# Patient Record
Sex: Male | Born: 1960
Health system: Southern US, Community
[De-identification: ages and names within clinical notes are randomized; demographics above are authoritative.]

---

## 2017-01-10 ENCOUNTER — Ambulatory Visit (INDEPENDENT_AMBULATORY_CARE_PROVIDER_SITE_OTHER): Payer: 59 | Admitting: Osteopathic Medicine

## 2017-01-10 ENCOUNTER — Encounter: Payer: Self-pay | Admitting: Osteopathic Medicine

## 2017-01-10 VITALS — BP 118/82 | HR 82 | Ht 71.0 in | Wt 211.0 lb

## 2017-01-10 DIAGNOSIS — M6740 Ganglion, unspecified site: Secondary | ICD-10-CM | POA: Diagnosis not present

## 2017-01-10 DIAGNOSIS — Z720 Tobacco use: Secondary | ICD-10-CM | POA: Diagnosis not present

## 2017-01-10 DIAGNOSIS — Z1211 Encounter for screening for malignant neoplasm of colon: Secondary | ICD-10-CM

## 2017-01-10 DIAGNOSIS — Z13220 Encounter for screening for lipoid disorders: Secondary | ICD-10-CM | POA: Diagnosis not present

## 2017-01-10 DIAGNOSIS — K5792 Diverticulitis of intestine, part unspecified, without perforation or abscess without bleeding: Secondary | ICD-10-CM

## 2017-01-10 DIAGNOSIS — Z23 Encounter for immunization: Secondary | ICD-10-CM | POA: Diagnosis not present

## 2017-01-10 MED ORDER — METRONIDAZOLE 500 MG PO TABS: 500.0000 mg | ORAL_TABLET | Freq: Three times a day (TID) | ORAL | 0 refills | Status: DC

## 2017-01-10 MED ORDER — CIPROFLOXACIN HCL 500 MG PO TABS: 500.0000 mg | ORAL_TABLET | Freq: Two times a day (BID) | ORAL | 0 refills | Status: DC

## 2017-01-10 NOTE — Patient Instructions (Addendum)
Clear Liquid Diet, Adult A clear liquid diet is a diet that includes only liquids that you can see through. You may need to follow a clear liquid diet if:  You develop a medical condition right before or after you have surgery.  You were not able to eat food for a long period of time.  You had a condition that gave you diarrhea.  You are going to have an exam, such as a colonoscopy, in which instruments will be put into your body to look at parts of your digestive system.  You are going to have bowel surgery. The usual goals of this diet are:  To rest the stomach and digestive system as much as possible.  To keep you hydrated.  To make sure you get some calories for energy.  To help you return to normal digestion. Most people need to follow this diet for only a short period of time. What do I need to know about this diet?  A clear liquid is a liquid that you can see through when you hold it up to a light.  A clear liquid diet does not provide all the nutrients that you need. It is important to choose a variety of the liquids that are allowed on this diet. That way, you will get as many nutrients as possible.  If you are not sure whether you can have certain items, ask your health care provider. What can I have?  Water and flavored water.  Fruit juices that do not have pulp, such as cranberry juice and apple juice.  Tea and coffee without milk or cream.  Clear bouillon or broth.  Broth-based soups that have been strained.  Flavored gelatins.  Honey.  Sugar water.  Frozen ice or frozen ice pops that do not contain milk, yogurt, fruit pieces, or fruit pulp.  Clear sodas.  Clear sports drinks. The items listed above may not be a complete list of recommended liquids. Contact your dietitian for more options.  What can I not have?  Juices that have pulp.  Milk.  Cream or cream-based soups.  Yogurt. The items listed above may not be a complete list of liquids to  avoid. Contact your dietitian for more information.  Summary  A clear liquid diet is a diet that includes only liquids that you can see through.  The goal of this diet is to help you recover by resting your digestive system, keeping you hydrated, and providing nutrients.  Make sure to avoid liquids with milk, cream, or pulp while on this diet. This information is not intended to replace advice given to you by your health care provider. Make sure you discuss any questions you have with your health care provider. Document Released: 11/01/2005 Document Revised: 06/15/2016 Document Reviewed: 09/28/2013 Elsevier Interactive Patient Education  2017 [employer]

## 2017-01-10 NOTE — Progress Notes (Signed)
HPI: Austin Reid is a 56 y.o. male  who presents to [facility] today, 01/10/17,  for chief complaint of:  Chief Complaint  Patient presents with  . Establish Care    Abd pain . Context: Has happened maybe 6 times over the past 10 years or so, typically will get left lower quadrant abdominal pain and fever associated with some loose stool, last for couple days and then goes away on its own. . Location: Left lower quadrant . Quality: Tenderness, sharp pain . Duration: Going on for about a day . Assoc signs/symptoms: Fever up to 100.1 last night, no nausea/vomiting, about 5 episodes of loose stool  Smoker: Patient has plans to quit within the next few months. No quit date decided on just yet. Not interested in medications at this time, would like to try cold [place].  Lump on wrist: Has been there maybe a few months, nonpainful, does not interfere with motion of the rest  Past medical, surgical, social and family history reviewed: There are no active problems to display for this patient.  History reviewed. No pertinent surgical history. Social History  Substance Use Topics  . Smoking status: Unknown If Ever Smoked  . Smokeless tobacco: Never Used  . Alcohol use Not on file   History reviewed. No pertinent family history.   Current medication list and allergy/intolerance information reviewed:   No current outpatient prescriptions on file.   No current facility-administered medications for this visit.    No Known Allergies    Review of Systems:  Constitutional:  +fever, no chills, No recent illness, No unintentional weight changes. No significant fatigue.   HEENT: No  headache, no vision change, no hearing change, No sore throat, No  sinus pressure  Cardiac: No  chest pain, No  pressure, No palpitations, No  Orthopnea  Respiratory:  No  shortness of breath. No  Cough  Gastrointestinal: +abdominal pain, No  nausea, No  vomiting,  No   blood in stool, +diarrhea, No  constipation   Musculoskeletal: No new myalgia/arthralgia  Genitourinary: No  incontinence, No  abnormal genital bleeding, No abnormal genital discharge  Skin: No  Rash, No other wounds/concerning lesions  Hem/Onc: No  easy bruising/bleeding, No  abnormal lymph node  Endocrine: No cold intolerance,  No heat intolerance. No polyuria/polydipsia/polyphagia   Neurologic: No  weakness, No  dizziness, No  slurred speech/focal weakness/facial droop  Psychiatric: No  concerns with depression, No  concerns with anxiety, No sleep problems, No mood problems  Exam:  BP 118/82   Pulse 82   Ht 5\' 11"  (1.803 m)   Wt 211 lb (95.7 kg)   BMI 29.43 kg/m   Constitutional: VS see above. General Appearance: alert, well-developed, well-nourished, NAD  Eyes: Normal lids and conjunctive, non-icteric sclera  Ears, Nose, Mouth, Throat: MMM, Normal external inspection ears/nares/mouth/lips/gums. TM normal bilaterally. Pharynx/tonsils no erythema, no exudate. Nasal mucosa normal.   Neck: No masses, trachea midline. No thyroid enlargement. No tenderness/mass appreciated. No lymphadenopathy  Respiratory: Normal respiratory effort. no wheeze, no rhonchi, no rales  Cardiovascular: S1/S2 normal, no murmur, no rub/gallop auscultated. RRR. No lower extremity edema. Pedal pulse II/IV bilaterally DP and PT. No carotid bruit or JVD. No abdominal aortic bruit.  Gastrointestinal: +TTP LLQ, no masses. No hepatomegaly, no splenomegaly. No hernia appreciated. Bowel sounds normal. Rectal exam deferred.   Musculoskeletal: Gait normal. No clubbing/cyanosis of digits.   Neurological: Normal balance/coordination. No tremor.   Skin: warm, dry, intact. No rash/ulcer.  No concerning nevi or subq nodules on limited exam.    Psychiatric: Normal judgment/insight. Normal mood and affect. Oriented x3.     ASSESSMENT/PLAN:   Likely diverticulitis/colitis. We'll defer CT for now, start  antibiotics, ER precautions reviewed in detail with the patient and he verbalizes understanding.  Encouraged smoking cessation, quit date  Opts for cologuard colon cancer screening, patient is aware that may need colonoscopy depending on results  Plan for routine labs  Decline flu vaccine, up-to-date on tetanus  Diverticulitis of intestine without bleeding, unspecified complication status, unspecified part of intestinal tract - Plan: ciprofloxacin (CIPRO) 500 MG tablet, metroNIDAZOLE (FLAGYL) 500 MG tablet, CBC with Differential/Platelet, COMPLETE METABOLIC PANEL WITH GFR  Colon cancer screening - Plan: Cologuard  Need for diphtheria-tetanus-pertussis (Tdap) vaccine, adult/adolescent - Plan: Tdap vaccine greater than or equal to 7yo IM  Tobacco abuse  Lipid screening - Plan: Lipid panel  Ganglion cyst    Visit summary with medication list and pertinent instructions was printed for patient to review. All questions at time of visit were answered - patient instructed to contact office with any additional concerns. ER/RTC precautions were reviewed with the patient. Follow-up plan: Return in about 4 days (around 01/14/2017) for recheck abdominal pain, go over labs.

## 2017-01-11 ENCOUNTER — Telehealth: Payer: Self-pay

## 2017-01-11 LAB — CBC WITH DIFFERENTIAL/PLATELET
Basophils Absolute: 0 cells/uL (ref 0–200)
Basophils Relative: 0 %
EOS ABS: 116 {cells}/uL (ref 15–500)
Eosinophils Relative: 1 %
HEMATOCRIT: 48.6 % (ref 38.5–50.0)
Hemoglobin: 16.8 g/dL (ref 13.2–17.1)
LYMPHS PCT: 19 %
Lymphs Abs: 2204 cells/uL (ref 850–3900)
MCH: 30.7 pg (ref 27.0–33.0)
MCHC: 34.6 g/dL (ref 32.0–36.0)
MCV: 88.7 fL (ref 80.0–100.0)
MONO ABS: 1276 {cells}/uL — AB (ref 200–950)
MONOS PCT: 11 %
MPV: 9.4 fL (ref 7.5–12.5)
NEUTROS PCT: 69 %
Neutro Abs: 8004 cells/uL — ABNORMAL HIGH (ref 1500–7800)
Platelets: 178 10*3/uL (ref 140–400)
RBC: 5.48 MIL/uL (ref 4.20–5.80)
RDW: 13.6 % (ref 11.0–15.0)
WBC: 11.6 10*3/uL — ABNORMAL HIGH (ref 3.8–10.8)

## 2017-01-11 LAB — COMPLETE METABOLIC PANEL WITH GFR
ALT: 20 U/L (ref 9–46)
AST: 16 U/L (ref 10–35)
Albumin: 4.4 g/dL (ref 3.6–5.1)
Alkaline Phosphatase: 55 U/L (ref 40–115)
BUN: 10 mg/dL (ref 7–25)
CALCIUM: 9.7 mg/dL (ref 8.6–10.3)
CHLORIDE: 101 mmol/L (ref 98–110)
CO2: 29 mmol/L (ref 20–31)
CREATININE: 0.99 mg/dL (ref 0.70–1.33)
GFR, EST NON AFRICAN AMERICAN: 84 mL/min (ref >=60)
Glucose, Bld: 97 mg/dL (ref 65–99)
POTASSIUM: 4.1 mmol/L (ref 3.5–5.3)
Sodium: 138 mmol/L (ref 135–146)
Total Bilirubin: 1 mg/dL (ref 0.2–1.2)
Total Protein: 7.2 g/dL (ref 6.1–8.1)

## 2017-01-11 LAB — LIPID PANEL
CHOLESTEROL: 215 mg/dL — AB (ref <200)
HDL: 51 mg/dL (ref >40)
LDL Cholesterol: 129 mg/dL — ABNORMAL HIGH (ref <100)
TRIGLYCERIDES: 176 mg/dL — AB (ref <150)
Total CHOL/HDL Ratio: 4.2 Ratio (ref <5.0)
VLDL: 35 mg/dL — AB (ref <30)

## 2017-01-11 NOTE — Telephone Encounter (Signed)
Spoke to patient gave him results as noted below.Patient stated that he will try a cholesterol medication. Please advise Rx is to go to [facility].  Arionna Hoggard,CMA

## 2017-01-25 DIAGNOSIS — Z1211 Encounter for screening for malignant neoplasm of colon: Secondary | ICD-10-CM | POA: Diagnosis not present

## 2017-01-25 DIAGNOSIS — Z1212 Encounter for screening for malignant neoplasm of rectum: Secondary | ICD-10-CM | POA: Diagnosis not present

## 2017-01-31 LAB — COLOGUARD: Cologuard: NEGATIVE

## 2017-02-01 ENCOUNTER — Telehealth: Payer: Self-pay | Admitting: Osteopathic Medicine

## 2017-02-01 NOTE — Telephone Encounter (Signed)
Please call patient: Cologuard test was negative, plan to repeat in 3 years

## 2017-02-03 ENCOUNTER — Encounter: Payer: Self-pay | Admitting: Osteopathic Medicine

## 2017-02-17 NOTE — Telephone Encounter (Signed)
Spoke to patient gave him results as noted below. Deaja Rizo,CMA  

## 2017-08-15 ENCOUNTER — Telehealth: Payer: Self-pay | Admitting: Family Medicine

## 2017-08-15 NOTE — Telephone Encounter (Signed)
I talked to patient on Saturday September 29. He was complaining of abdominal pain consistent with previous episodes of diverticulitis. He had a history of fever but at the time was feeling a bit better and afebrile after Tylenol. He denies any vomiting or diarrhea. He requested a prescription for antibiotics that worked last time. I agreed to prescribe Cipro and Flagyl and discussed precautions. He should present to the emergency department if not improving or if worsening.

## 2017-12-24 ENCOUNTER — Emergency Department (INDEPENDENT_AMBULATORY_CARE_PROVIDER_SITE_OTHER): Payer: No Typology Code available for payment source

## 2017-12-24 ENCOUNTER — Emergency Department (INDEPENDENT_AMBULATORY_CARE_PROVIDER_SITE_OTHER)
Admission: EM | Admit: 2017-12-24 | Discharge: 2017-12-24 | Disposition: A | Discharge status: Home / Self Care | Payer: No Typology Code available for payment source | Source: Home / Self Care | Attending: Family Medicine | Admitting: Family Medicine

## 2017-12-24 ENCOUNTER — Encounter: Payer: Self-pay | Admitting: Emergency Medicine

## 2017-12-24 DIAGNOSIS — M7989 Other specified soft tissue disorders: Secondary | ICD-10-CM | POA: Diagnosis not present

## 2017-12-24 DIAGNOSIS — S6391XA Sprain of unspecified part of right wrist and hand, initial encounter: Secondary | ICD-10-CM | POA: Diagnosis not present

## 2017-12-24 NOTE — ED Triage Notes (Signed)
Patient complaining of right hand injury, smashed against the wall on Wednesday.

## 2017-12-24 NOTE — ED Provider Notes (Signed)
[facility]    CSN: [id number] Arrival date & time: 12/24/17  1713     History   Chief Complaint Chief Complaint  Patient presents with  . Hand Injury    HPI Damontae Loppnow is a 57 y.o. male.   HPI  Sabastien Tyler is a 57 y.o. male presenting to UC with c/o 1 week of Right hand pain that started suddenly after accidentally hitting the back of his hand on a piece of dry wall. Pt states he was pulling on something when his hand slipped, then hit the wall behind him.  Pain is aching and sore, worse with palpation and movement, 3/10 in severity, associated swelling to the back of his hand. He is Right hand dominant. No numbness or tingling. He has not taken anything for pain.   History reviewed. No pertinent past medical history.  Patient Active Problem List   Diagnosis Date Noted  . Tobacco abuse 01/10/2017  . Colon cancer screening 01/10/2017  . Ganglion cyst 01/10/2017  . Diverticulitis of intestine without bleeding 01/10/2017    History reviewed. No pertinent surgical history.     Home Medications    Prior to Admission medications   Not on File    Family History Family History  Problem Relation Age of Onset  . Alcohol abuse Father   . Anuerysm Father   . Alcohol abuse Brother   . Alcohol abuse Paternal Uncle     Social History Social History   Tobacco Use  . Smoking status: Unknown If Ever Smoked  . Smokeless tobacco: Never Used  Substance Use Topics  . Alcohol use: Not on file  . Drug use: Not on file     Allergies   Patient has no known allergies.   Review of Systems Review of Systems  Musculoskeletal: Positive for arthralgias, joint swelling and myalgias.  Skin: Negative for color change and wound.  Neurological: Positive for weakness. Negative for numbness.     Physical Exam Triage Vital Signs ED Triage Vitals [12/24/17 1742]  Enc Vitals Group     BP 138/90     Pulse Rate 63     Resp      Temp 98.3 F (36.8 C)     Temp  Source Oral     SpO2 97 %     Weight 206 lb 12 oz (93.8 kg)     Height 5\' 11"  (1.803 m)     Head Circumference      Peak Flow      Pain Score 3     Pain Loc      Pain Edu?      Excl. in GC?    No data found.  Updated Vital Signs BP 138/90 (BP Location: Right Arm)   Pulse 63   Temp 98.3 F (36.8 C) (Oral)   Ht 5\' 11"  (1.803 m)   Wt 206 lb 12 oz (93.8 kg)   SpO2 97%   BMI 28.84 kg/m   Visual Acuity Right Eye Distance:   Left Eye Distance:   Bilateral Distance:    Right Eye Near:   Left Eye Near:    Bilateral Near:     Physical Exam  Constitutional: He is oriented to person, place, and time. He appears well-developed and well-nourished. No distress.  HENT:  Head: Normocephalic and atraumatic.  Eyes: EOM are normal.  Neck: Normal range of motion.  Cardiovascular: Normal rate.  Pulmonary/Chest: Effort normal.  Musculoskeletal: Normal range of motion. He exhibits edema  and tenderness.  Right hand: mild to moderate edema to dorsal aspect. Tender. Full ROM with 4/5 grip strength compared to Left hand. Right wrist: full ROM, non-tender.  Neurological: He is alert and oriented to person, place, and time.  Skin: Skin is warm and dry. He is not diaphoretic.  Right hand: skin in tact. No ecchymosis or erythema.  Psychiatric: He has a normal mood and affect. His behavior is normal.  Nursing note and vitals reviewed.    UC Treatments / Results  Labs (all labs ordered are listed, but only abnormal results are displayed) Labs Reviewed - No data to display  EKG  EKG Interpretation None       Radiology Dg Hand Complete Right  Result Date: 12/24/2017 CLINICAL DATA:  Hit hand against wall EXAM: RIGHT HAND - COMPLETE 3+ VIEW COMPARISON:  None. FINDINGS: Frontal, oblique, and lateral views were obtained. There is soft tissue swelling dorsally and medially. No acute fracture or dislocation evident. There is osteoarthritic change in the first IP joint. Other joint spaces  appear normal. No erosive change. IMPRESSION: Soft tissue swelling dorsally and medially. No acute fracture or dislocation. Osteoarthritic change first IP joint. Other joint spaces appear unremarkable. Electronically Signed   By: Bretta BangWilliam  Woodruff III M.D.   On: 12/24/2017 17:57    Procedures Procedures (including critical care time)  Medications Ordered in UC Medications - No data to display   Initial Impression / Assessment and Plan / UC Course  I have reviewed the triage vital signs and the nursing notes.  Pertinent labs & imaging results that were available during my care of the patient were reviewed by me and considered in my medical decision making (see chart for details).     No evidence of fracture or dislocation noted on exam. Hand placed in removable splint for comfort. Home care instructions provided Encouraged use of acetaminophen and ibuprofen F/u with PCP and Sports Medicine as needed.   Final Clinical Impressions(s) / UC Diagnoses   Final diagnoses:  Hand sprain, right, initial encounter    ED Discharge Orders    None       Controlled Substance Prescriptions [place] Controlled Substance Registry consulted? Not Applicable   Rolla Platehelps, Sausha Raymond O, PA-C 12/25/17 1125

## 2017-12-24 NOTE — Discharge Instructions (Signed)
  You may take 500mg acetaminophen every 4-6 hours or in combination with ibuprofen 400-600mg every 6-8 hours as needed for pain and inflammation.  

## 2017-12-27 ENCOUNTER — Telehealth: Payer: Self-pay

## 2017-12-27 DIAGNOSIS — S6990XA Unspecified injury of unspecified wrist, hand and finger(s), initial encounter: Secondary | ICD-10-CM

## 2017-12-27 NOTE — Telephone Encounter (Signed)
Pt called stating he will required a referral for Urgent care visit on 12/24/17. Pt currently has focus plan. Thanks.

## 2017-12-27 NOTE — Telephone Encounter (Signed)
Pt has been notified of referral. No other inquiries asked during time of call.

## 2017-12-27 NOTE — Telephone Encounter (Signed)
Referral is in.

## 2018-05-10 ENCOUNTER — Encounter: Payer: Self-pay | Admitting: Osteopathic Medicine

## 2018-05-10 ENCOUNTER — Ambulatory Visit (INDEPENDENT_AMBULATORY_CARE_PROVIDER_SITE_OTHER): Payer: No Typology Code available for payment source | Admitting: Osteopathic Medicine

## 2018-05-10 VITALS — BP 128/91 | HR 68 | Temp 98.1°F | Wt 204.7 lb

## 2018-05-10 DIAGNOSIS — R350 Frequency of micturition: Secondary | ICD-10-CM

## 2018-05-10 DIAGNOSIS — Z0000 Encounter for general adult medical examination without abnormal findings: Secondary | ICD-10-CM | POA: Diagnosis not present

## 2018-05-10 DIAGNOSIS — F17210 Nicotine dependence, cigarettes, uncomplicated: Secondary | ICD-10-CM

## 2018-05-10 DIAGNOSIS — Z125 Encounter for screening for malignant neoplasm of prostate: Secondary | ICD-10-CM

## 2018-05-10 DIAGNOSIS — Z13220 Encounter for screening for lipoid disorders: Secondary | ICD-10-CM | POA: Diagnosis not present

## 2018-05-10 DIAGNOSIS — Z23 Encounter for immunization: Secondary | ICD-10-CM | POA: Diagnosis not present

## 2018-05-10 DIAGNOSIS — Z122 Encounter for screening for malignant neoplasm of respiratory organs: Secondary | ICD-10-CM

## 2018-05-10 DIAGNOSIS — Z131 Encounter for screening for diabetes mellitus: Secondary | ICD-10-CM | POA: Diagnosis not present

## 2018-05-10 LAB — POCT URINALYSIS DIPSTICK
Bilirubin, UA: NEGATIVE
GLUCOSE UA: NEGATIVE
Ketones, UA: NEGATIVE
Leukocytes, UA: NEGATIVE
Nitrite, UA: NEGATIVE
PH UA: 7 (ref 5.0–8.0)
Protein, UA: NEGATIVE
Spec Grav, UA: 1.02 (ref 1.010–1.025)
UROBILINOGEN UA: 0.2 U/dL

## 2018-05-10 MED ORDER — VARENICLINE TARTRATE 1 MG PO TABS: 1.0000 mg | ORAL_TABLET | Freq: Two times a day (BID) | ORAL | 0 refills | Status: AC

## 2018-05-10 MED ORDER — VARENICLINE TARTRATE 0.5 MG X 11 & 1 MG X 42 PO MISC: ORAL | 0 refills | Status: AC

## 2018-05-10 NOTE — Patient Instructions (Addendum)
General Preventive Care  Most recent routine screening lipids/other labs: ordered today   Tobacco: will work on quitting.Rx sent for Chantix. Alcohol: moderation is ok for most people. Recreational/Illicit Drugs: don't!  Exercise: as tolerated to reduce risk of cardiovascular disease and diabetes  Vaccines  Flu vaccine: recommended every fall (by Halloween!)  Shingles vaccine: Shingrix recommended after age 59 - will call you once we have this in stock   Pneumonia vaccines: Prevnar and Pneumovax recommended after age 59, Pneumovax sooner if smoker - think about getting the Pneumovax!   Tetanus booster: Tdap recommended every 10 years  Cancer screenings   Colon cancer screening: you are caught up on this   Prostate cancer screening: recommendations vary, optional PSA blood test form en around age 59  Lung cancer screening: age 59+ w/ smoking history - referral sent   Infection screenings . HIV: recommended screening at least once age 59-65, more often if risk factors  . Gonorrhea/Chlamydia: many insurances require testing for anyone on birth control pills, otherwise screening as needed . Hepatitis C: recommended for anyone born [id number]  Other . Aspirin: will consider based on labs . Advanced Directive: Living Will and/or Healthcare Power of Attorney recommended for everyone, regardless of age or health . Cholesterol & diabetes: recommended screening annually   Varenicline oral tablets What is this medicine? VARENICLINE (var EN i kleen) is used to help people quit smoking. It can reduce the symptoms caused by stopping smoking. It is used with a patient support program recommended by your physician. This medicine may be used for other purposes; ask your health care provider or pharmacist if you have questions. COMMON BRAND NAME(S): Chantix What should I tell my health care provider before I take this medicine? They need to know if you have any of these conditions: -bipolar  disorder, depression, schizophrenia or other mental illness -heart disease -if you often drink alcohol -kidney disease -peripheral vascular disease -seizures -stroke -suicidal thoughts, plans, or attempt; a previous suicide attempt by you or a family member -an unusual or allergic reaction to varenicline, other medicines, foods, dyes, or preservatives -pregnant or trying to get pregnant -breast-feeding How should I use this medicine? Take this medicine by mouth after eating. Take with a full glass of water. Follow the directions on the prescription label. Take your doses at regular intervals. Do not take your medicine more often than directed. There are 3 ways you can use this medicine to help you quit smoking; talk to your health care professional to decide which plan is right for you: 1) you can choose a quit date and start this medicine 1 week before the quit date, or, 2) you can start taking this medicine before you choose a quit date, and then pick a quit date between day 8 and 35 days of treatment, or, 3) if you are not sure that you are able or willing to quit smoking right away, start taking this medicine and slowly decrease the amount you smoke as directed by your health care professional with the goal of being cigarette-free by week 12 of treatment. Stick to your plan; ask about support groups or other ways to help you remain cigarette-free. If you are motivated to quit smoking and did not succeed during a previous attempt with this medicine for reasons other than side effects, or if you returned to smoking after this treatment, speak with your health care professional about whether another course of this medicine may be right for you. A special MedGuide  will be given to you by the pharmacist with each prescription and refill. Be sure to read this information carefully each time. Talk to your pediatrician regarding the use of this medicine in children. This medicine is not approved for  use in children. Overdosage: If you think you have taken too much of this medicine contact a poison control center or emergency room at once. NOTE: This medicine is only for you. Do not share this medicine with others. What if I miss a dose? If you miss a dose, take it as soon as you can. If it is almost time for your next dose, take only that dose. Do not take double or extra doses. What may interact with this medicine? -alcohol or any product that contains alcohol -insulin -other stop smoking aids -theophylline -warfarin This list may not describe all possible interactions. Give your health care provider a list of all the medicines, herbs, non-prescription drugs, or dietary supplements you use. Also tell them if you smoke, drink alcohol, or use illegal drugs. Some items may interact with your medicine. What should I watch for while using this medicine? Visit your doctor or health care professional for regular check ups. Ask for ongoing advice and encouragement from your doctor or healthcare professional, friends, and family to help you quit. If you smoke while on this medication, quit again Your mouth may get dry. Chewing sugarless gum or sucking hard candy, and drinking plenty of water may help. Contact your doctor if the problem does not go away or is severe. You may get drowsy or dizzy. Do not drive, use machinery, or do anything that needs mental alertness until you know how this medicine affects you. Do not stand or sit up quickly, especially if you are an older patient. This reduces the risk of dizzy or fainting spells. Sleepwalking can happen during treatment with this medicine, and can sometimes lead to behavior that is harmful to you, other people, or property. Stop taking this medicine and tell your doctor if you start sleepwalking or have other unusual sleep-related activity. Decrease the amount of alcoholic beverages that you drink during treatment with this medicine until you know if  this medicine affects your ability to tolerate alcohol. Some people have experienced increased drunkenness (intoxication), unusual or sometimes aggressive behavior, or no memory of things that have happened (amnesia) during treatment with this medicine. The use of this medicine may increase the chance of suicidal thoughts or actions. Pay special attention to how you are responding while on this medicine. Any worsening of mood, or thoughts of suicide or dying should be reported to your health care professional right away. What side effects may I notice from receiving this medicine? Side effects that you should report to your doctor or health care professional as soon as possible: -allergic reactions like skin rash, itching or hives, swelling of the face, lips, tongue, or throat -acting aggressive, being angry or violent, or acting on dangerous impulses -breathing problems -changes in vision -chest pain or chest tightness -confusion, trouble speaking or understanding -new or worsening depression, anxiety, or panic attacks -extreme increase in activity and talking (mania) -fast, irregular heartbeat -feeling faint or lightheaded, falls -fever -pain in legs when walking -problems with balance, talking, walking -redness, blistering, peeling or loosening of the skin, including inside the mouth -ringing in ears -seeing or hearing things that aren't there (hallucinations) -seizures -sleepwalking -sudden numbness or weakness of the face, arm or leg -thoughts about suicide or dying, or attempts to commit  suicide -trouble passing urine or change in the amount of urine -unusual bleeding or bruising -unusually weak or tired Side effects that usually do not require medical attention (report to your doctor or health care professional if they continue or are bothersome): -constipation -headache -nausea, vomiting -strange dreams -stomach gas -trouble sleeping This list may not describe all possible  side effects. Call your doctor for medical advice about side effects. You may report side effects to FDA at 1-800-FDA-1088. Where should I keep my medicine? Keep out of the reach of children. Store at room temperature between 15 and 30 degrees C (59 and 86 degrees F). Throw away any unused medicine after the expiration date. NOTE: This sheet is a summary. It may not cover all possible information. If you have questions about this medicine, talk to your doctor, pharmacist, or health care provider.  2018 Elsevier/Gold Standard (2015-07-17 16:14:23)

## 2018-05-10 NOTE — Progress Notes (Signed)
HPI: Austin Reid is a 57 y.o. male who  has no past medical history on file.  he presents to [facility] today, 05/10/18,  for chief complaint of: Annual physical Possible UTI   Patient here for annual physical / wellness exam.  See preventive care reviewed as below.  Recent labs reviewed in detail with the patient.   Additional concerns today include:  Occasional feeling of urinary frequency, no dysuria or hematuria.  Would like urine checked, concern for UTI    Past medical, surgical, social and family history reviewed:  Patient Active Problem List   Diagnosis Date Noted  . Tobacco abuse 01/10/2017  . Colon cancer screening 01/10/2017  . Ganglion cyst 01/10/2017  . Diverticulitis of intestine without bleeding 01/10/2017    History reviewed. No pertinent surgical history.  Social History   Tobacco Use  . Smoking status: Former Smoker    Packs/day: 1.50  . Smokeless tobacco: Never Used  Substance Use Topics  . Alcohol use: Yes    Family History  Problem Relation Age of Onset  . Alcohol abuse Father   . Anuerysm Father   . Alcohol abuse Brother   . Alcohol abuse Paternal Uncle      Current medication list and allergy/intolerance information reviewed:    No current outpatient medications on file.   No current facility-administered medications for this visit.     No Known Allergies    Review of Systems:  Constitutional:  No  fever, no chills, No recent illness, No unintentional weight changes. No significant fatigue.   HEENT: No  headache, no vision change, no hearing change, No sore throat, No  sinus pressure  Cardiac: No  chest pain, No  pressure, No palpitations, No  Orthopnea  Respiratory:  No  shortness of breath. No  Cough  Gastrointestinal: No  abdominal pain, No  nausea, No  vomiting,  No  blood in stool, No  diarrhea, No  constipation   Musculoskeletal: No new myalgia/arthralgia  Skin: No  Rash, No other  wounds/concerning lesions  Genitourinary: No  incontinence, No  abnormal genital bleeding, No abnormal genital discharge  Hem/Onc: No  easy bruising/bleeding, No  abnormal lymph node  Endocrine: No cold intolerance,  No heat intolerance. No polyuria/polydipsia/polyphagia   Neurologic: No  weakness, No  dizziness, No  slurred speech/focal weakness/facial droop  Psychiatric: No  concerns with depression, No  concerns with anxiety, No sleep problems, No mood problems  Exam:  BP (!) 128/91 (BP Location: Left Arm, Patient Position: Sitting, Cuff Size: Normal)   Pulse 68   Temp 98.1 F (36.7 C) (Oral)   Wt 204 lb 11.2 oz (92.9 kg)   BMI 28.55 kg/m   Constitutional: VS see above. General Appearance: alert, well-developed, well-nourished, NAD  Eyes: Normal lids and conjunctive, non-icteric sclera  Ears, Nose, Mouth, Throat: MMM, Normal external inspection ears/nares/mouth/lips/gums. TM normal bilaterally. Pharynx/tonsils no erythema, no exudate. Nasal mucosa normal.   Neck: No masses, trachea midline. No thyroid enlargement. No tenderness/mass appreciated. No lymphadenopathy  Respiratory: Normal respiratory effort. no wheeze, no rhonchi, no rales  Cardiovascular: S1/S2 normal, no murmur, no rub/gallop auscultated. RRR. No lower extremity edema.   Gastrointestinal: Nontender, no masses. No hepatomegaly, no splenomegaly. No hernia appreciated. Bowel sounds normal. Rectal exam deferred.   Musculoskeletal: Gait normal. No clubbing/cyanosis of digits.   Neurological: Normal balance/coordination. No tremor. No cranial nerve deficit on limited exam. Motor and sensation intact and symmetric. Cerebellar reflexes intact.   Skin:  warm, dry, intact. No rash/ulcer. No concerning nevi or subq nodules on limited exam.    Psychiatric: Normal judgment/insight. Normal mood and affect. Oriented x3.    Results for orders placed or performed in visit on 05/10/18 (from the past 72 hour(s))  CBC      Status: None   Collection Time: 05/10/18  4:07 PM  Result Value Ref Range   WBC 7.8 3.8 - 10.8 Thousand/uL   RBC 5.46 4.20 - 5.80 Million/uL   Hemoglobin 16.9 13.2 - 17.1 g/dL   HCT 16.148.6 09.638.5 - 04.550.0 %   MCV 89.0 80.0 - 100.0 fL   MCH 31.0 27.0 - 33.0 pg   MCHC 34.8 32.0 - 36.0 g/dL   RDW 40.912.4 81.111.0 - 91.415.0 %   Platelets 220 140 - 400 Thousand/uL   MPV 9.5 7.5 - 12.5 fL  COMPLETE METABOLIC PANEL WITH GFR     Status: None   Collection Time: 05/10/18  4:07 PM  Result Value Ref Range   Glucose, Bld 96 65 - 99 mg/dL    Comment: .            Fasting reference interval .    BUN 12 7 - 25 mg/dL   Creat 7.820.98 9.560.70 - 2.131.33 mg/dL    Comment: For patients >59 years of age, the reference limit for Creatinine is approximately 13% higher for people identified as African-American. .    GFR, Est Non African American 85 > OR = 60 mL/min/1.2473m2   GFR, Est African American 98 > OR = 60 mL/min/1.3773m2   BUN/Creatinine Ratio NOT APPLICABLE 6 - 22 (calc)   Sodium 137 135 - 146 mmol/L   Potassium 4.3 3.5 - 5.3 mmol/L   Chloride 101 98 - 110 mmol/L   CO2 29 20 - 32 mmol/L   Calcium 9.9 8.6 - 10.3 mg/dL   Total Protein 7.6 6.1 - 8.1 g/dL   Albumin 4.7 3.6 - 5.1 g/dL   Globulin 2.9 1.9 - 3.7 g/dL (calc)   AG Ratio 1.6 1.0 - 2.5 (calc)   Total Bilirubin 0.6 0.2 - 1.2 mg/dL   Alkaline phosphatase (APISO) 69 40 - 115 U/L   AST 23 10 - 35 U/L   ALT 20 9 - 46 U/L  Lipid panel     Status: Abnormal   Collection Time: 05/10/18  4:07 PM  Result Value Ref Range   Cholesterol 259 (H) <200 mg/dL   HDL 50 >08>40 mg/dL   Triglycerides 657269 (H) <150 mg/dL   LDL Cholesterol (Calc) 165 (H) mg/dL (calc)    Comment: Reference range: <100 . Desirable range <100 mg/dL for primary prevention;   <70 mg/dL for patients with CHD or diabetic patients  with > or = 2 CHD risk factors. Marland Kitchen. LDL-C is now calculated using the Daouda-Hopkins  calculation, which is a validated novel method providing  better accuracy than the  Friedewald equation in the  estimation of LDL-C.  Horald PollenMartin SS et al. Lenox AhrJAMA. 8469;629(522013;310(19): 2061-2068  (http://education.[facility].com/faq/FAQ164)    Total CHOL/HDL Ratio 5.2 (H) <5.0 (calc)   Non-HDL Cholesterol (Calc) 209 (H) <130 mg/dL (calc)    Comment: For patients with diabetes plus 1 major ASCVD risk  factor, treating to a non-HDL-C goal of <100 mg/dL  (LDL-C of <84<70 mg/dL) is considered a therapeutic  option.   PSA, Total with Reflex to PSA, Free     Status: None   Collection Time: 05/10/18  4:07 PM  Result Value Ref Range   PSA,  Total 3.4 < OR = 4.0 ng/mL    Comment: The Total PSA value from this assay system is  standardized against the equimolar PSA standard.  The test result will be approximately 20% higher  when compared to the [facility] Total PSA  (Siemens assay). Comparison of serial PSA results  should be interpreted with this fact in mind. Marland Kitchen PSA was performed using the Beckman Coulter  Immunoassay method. Values obtained from different  assay methods cannot be used interchangeably. PSA  levels, regardless of value, should not be  interpreted as absolute evidence of the presence or  absence of disease.   POCT Urinalysis Dipstick     Status: None   Collection Time: 05/10/18  4:42 PM  Result Value Ref Range   Color, UA YELLOW    Clarity, UA CLEAR    Glucose, UA Negative Negative   Bilirubin, UA NEGATIVE    Ketones, UA NEGATIVE    Spec Grav, UA 1.020 1.010 - 1.025   Blood, UA TRACE-INTACT    pH, UA 7.0 5.0 - 8.0   Protein, UA Negative Negative   Urobilinogen, UA 0.2 0.2 or 1.0 E.U./dL   Nitrite, UA NEGATIVE    Leukocytes, UA Negative Negative   Appearance     Odor    Urinalysis, microscopic only     Status: None   Collection Time: 05/10/18  4:45 PM  Result Value Ref Range   WBC, UA NONE SEEN 0 - 5 /HPF   RBC / HPF NONE SEEN 0 - 2 /HPF   Squamous Epithelial / LPF NONE SEEN < OR = 5 /HPF   Bacteria, UA NONE SEEN NONE SEEN /HPF   Hyaline Cast  NONE SEEN NONE SEEN /LPF      ASSESSMENT/PLAN:   Annual physical exam - Plan: CBC, COMPLETE METABOLIC PANEL WITH GFR, Lipid panel, PSA, Total with Reflex to PSA, Free  Smokes with greater than 30 pack year history - Plan: Ambulatory Referral for Lung Cancer Scre  Need for shingles vaccine  Need for 23-polyvalent pneumococcal polysaccharide vaccine  Prostate cancer screening  Lipid screening  Diabetes mellitus screening  Encounter for screening for lung cancer  Urinary frequency - Plan: POCT Urinalysis Dipstick, Urinalysis, microscopic only, Urine Culture, CANCELED: Urinalysis, microscopic only, CANCELED: Urine Culture    Patient Instructions  General Preventive Care  Most recent routine screening lipids/other labs: ordered today   Tobacco: will work on quitting.Rx sent for Chantix. Alcohol: moderation is ok for most people. Recreational/Illicit Drugs: don't!  Exercise: as tolerated to reduce risk of cardiovascular disease and diabetes  Vaccines  Flu vaccine: recommended every fall (by Halloween!)  Shingles vaccine: Shingrix recommended after age 70 - will call you once we have this in stock   Pneumonia vaccines: Prevnar and Pneumovax recommended after age 89, Pneumovax sooner if smoker - think about getting the Pneumovax!   Tetanus booster: Tdap recommended every 10 years  Cancer screenings   Colon cancer screening: you are caught up on this   Prostate cancer screening: recommendations vary, optional PSA blood test form en around age 61  Lung cancer screening: age 53+ w/ smoking history - referral sent   Infection screenings . HIV: recommended screening at least once age 16-65, more often if risk factors  . Gonorrhea/Chlamydia: many insurances require testing for anyone on birth control pills, otherwise screening as needed . Hepatitis C: recommended for anyone born 76-1967  Other . Aspirin: will consider based on labs . Advanced Directive: Living Will  and/or Healthcare  Power of Attorney recommended for everyone, regardless of age or health . Cholesterol & diabetes: recommended screening annually    Visit summary with medication list and pertinent instructions was printed for patient to review. All questions at time of visit were answered - patient instructed to contact office with any additional concerns. ER/RTC precautions were reviewed with the patient.   Follow-up plan: Return in about 1 year (around 05/11/2019) for annual physical, sooner if needed.   Please note: voice recognition software was used to produce this document, and typos may escape review. Please contact Dr. Lyn Hollingshead for any needed clarifications.

## 2018-05-11 ENCOUNTER — Encounter: Payer: Self-pay | Admitting: Osteopathic Medicine

## 2018-05-11 LAB — LIPID PANEL
Cholesterol: 259 mg/dL — ABNORMAL HIGH
HDL: 50 mg/dL
LDL Cholesterol (Calc): 165 mg/dL — ABNORMAL HIGH
Non-HDL Cholesterol (Calc): 209 mg/dL — ABNORMAL HIGH
Total CHOL/HDL Ratio: 5.2 (calc) — ABNORMAL HIGH
Triglycerides: 269 mg/dL — ABNORMAL HIGH

## 2018-05-11 LAB — COMPLETE METABOLIC PANEL WITH GFR
AG Ratio: 1.6 (calc) (ref 1.0–2.5)
ALT: 20 U/L (ref 9–46)
AST: 23 U/L (ref 10–35)
Albumin: 4.7 g/dL (ref 3.6–5.1)
Alkaline phosphatase (APISO): 69 U/L (ref 40–115)
BUN: 12 mg/dL (ref 7–25)
CALCIUM: 9.9 mg/dL (ref 8.6–10.3)
CHLORIDE: 101 mmol/L (ref 98–110)
CO2: 29 mmol/L (ref 20–32)
Creat: 0.98 mg/dL (ref 0.70–1.33)
GFR, Est African American: 98 mL/min/{1.73_m2} (ref >=60)
GFR, Est Non African American: 85 mL/min/{1.73_m2} (ref >=60)
GLUCOSE: 96 mg/dL (ref 65–99)
Globulin: 2.9 g/dL (calc) (ref 1.9–3.7)
Potassium: 4.3 mmol/L (ref 3.5–5.3)
SODIUM: 137 mmol/L (ref 135–146)
TOTAL PROTEIN: 7.6 g/dL (ref 6.1–8.1)
Total Bilirubin: 0.6 mg/dL (ref 0.2–1.2)

## 2018-05-11 LAB — CBC
HCT: 48.6 % (ref 38.5–50.0)
Hemoglobin: 16.9 g/dL (ref 13.2–17.1)
MCH: 31 pg (ref 27.0–33.0)
MCHC: 34.8 g/dL (ref 32.0–36.0)
MCV: 89 fL (ref 80.0–100.0)
MPV: 9.5 fL (ref 7.5–12.5)
Platelets: 220 10*3/uL (ref 140–400)
RBC: 5.46 Million/uL (ref 4.20–5.80)
RDW: 12.4 % (ref 11.0–15.0)
WBC: 7.8 10*3/uL (ref 3.8–10.8)

## 2018-05-11 LAB — PSA, TOTAL WITH REFLEX TO PSA, FREE: PSA, Total: 3.4 ng/mL (ref <=4.0)

## 2018-05-11 MED ORDER — ATORVASTATIN CALCIUM 20 MG PO TABS: 20.0000 mg | ORAL_TABLET | Freq: Every day | ORAL | 0 refills | Status: DC

## 2018-05-12 ENCOUNTER — Encounter (INDEPENDENT_AMBULATORY_CARE_PROVIDER_SITE_OTHER): Payer: Self-pay

## 2018-05-12 LAB — URINALYSIS, MICROSCOPIC ONLY
BACTERIA UA: NONE SEEN /HPF
HYALINE CAST: NONE SEEN /LPF
RBC / HPF: NONE SEEN /HPF (ref 0–2)
Squamous Epithelial / LPF: NONE SEEN /HPF (ref <=5)
WBC UA: NONE SEEN /HPF (ref 0–5)

## 2018-05-12 LAB — URINE CULTURE
MICRO NUMBER:: 90765824
RESULT: NO GROWTH
SPECIMEN QUALITY: ADEQUATE

## 2018-07-31 ENCOUNTER — Telehealth: Payer: Self-pay | Admitting: Osteopathic Medicine

## 2018-07-31 DIAGNOSIS — K5792 Diverticulitis of intestine, part unspecified, without perforation or abscess without bleeding: Secondary | ICD-10-CM

## 2018-07-31 MED ORDER — CIPROFLOXACIN HCL 500 MG PO TABS: 500.0000 mg | ORAL_TABLET | Freq: Two times a day (BID) | ORAL | 0 refills | Status: AC

## 2018-07-31 MED ORDER — METRONIDAZOLE 500 MG PO TABS: 500.0000 mg | ORAL_TABLET | Freq: Three times a day (TID) | ORAL | 0 refills | Status: AC

## 2018-07-31 NOTE — Telephone Encounter (Signed)
Pt has been updated.  

## 2018-07-31 NOTE — Telephone Encounter (Signed)
[facility] to pharmacy [facility]

## 2018-07-31 NOTE — Telephone Encounter (Signed)
Pt called. He is needing script for Divertiuculits he says he spoke to you about at his last visit.Marland Kitchen.  [facility] [place] would also like a call back when script has been sent. Thanks!

## 2018-08-30 ENCOUNTER — Other Ambulatory Visit: Payer: Self-pay | Admitting: Osteopathic Medicine

## 2018-08-30 NOTE — Telephone Encounter (Signed)
Please review for refill- patient at [facility] 

## 2019-09-06 ENCOUNTER — Other Ambulatory Visit: Payer: Self-pay | Admitting: Osteopathic Medicine

## 2019-09-06 NOTE — Telephone Encounter (Signed)
Requested medication (s) are due for refill today: yes  Requested medication (s) are on the active medication list: yes  Last refill:  03/18/2019  Future visit scheduled: no  Notes to clinic: review for refill Overdue for office visit   Requested Prescriptions  Pending Prescriptions Disp Refills   atorvastatin (LIPITOR) 20 MG tablet [Pharmacy Med Name: ATORVASTATIN^20 MG TABLET 20 Tablet] 30 tablet 2    Sig: TAKE ONE TABLET BY MOUTH ONCE DAILY     Cardiovascular:  Antilipid - Statins Failed - 09/06/2019 10:41 AM      Failed - Total Cholesterol in normal range and within 360 days    Cholesterol  Date Value Ref Range Status  05/10/2018 259 (H) <200 mg/dL Final         Failed - LDL in normal range and within 360 days    LDL Cholesterol (Calc)  Date Value Ref Range Status  05/10/2018 165 (H) mg/dL (calc) Final    Comment:    Reference range: <100 . Desirable range <100 mg/dL for primary prevention;   <70 mg/dL for patients with CHD or diabetic patients  with > or = 2 CHD risk factors. Marland Kitchen LDL-C is now calculated using the Tobby-Hopkins  calculation, which is a validated novel method providing  better accuracy than the Friedewald equation in the  estimation of LDL-C.  Cresenciano Genre et al. Annamaria Helling. 7741;287(86): 2061-2068  (http://education.[facility].com/faq/FAQ164)          Failed - HDL in normal range and within 360 days    HDL  Date Value Ref Range Status  05/10/2018 50 >40 mg/dL Final         Failed - Triglycerides in normal range and within 360 days    Triglycerides  Date Value Ref Range Status  05/10/2018 269 (H) <150 mg/dL Final         Failed - Valid encounter within last 12 months    Recent Outpatient Visits          1 year ago Annual physical exam    [facility], Lanelle Bal, DO   2 years ago Diverticulitis of intestine without bleeding, unspecified complication status, unspecified part of intestinal tract   [facility], Lanelle Bal, [place] - Patient is not pregnant

## 2019-09-27 IMAGING — DX DG HAND COMPLETE 3+V*R*
3 series · 3 of 3 positions shown · non-contrast
Comparison: None.

CLINICAL DATA: Hit hand against wall

EXAM:
RIGHT HAND - COMPLETE 3+ VIEW

[hand pa]
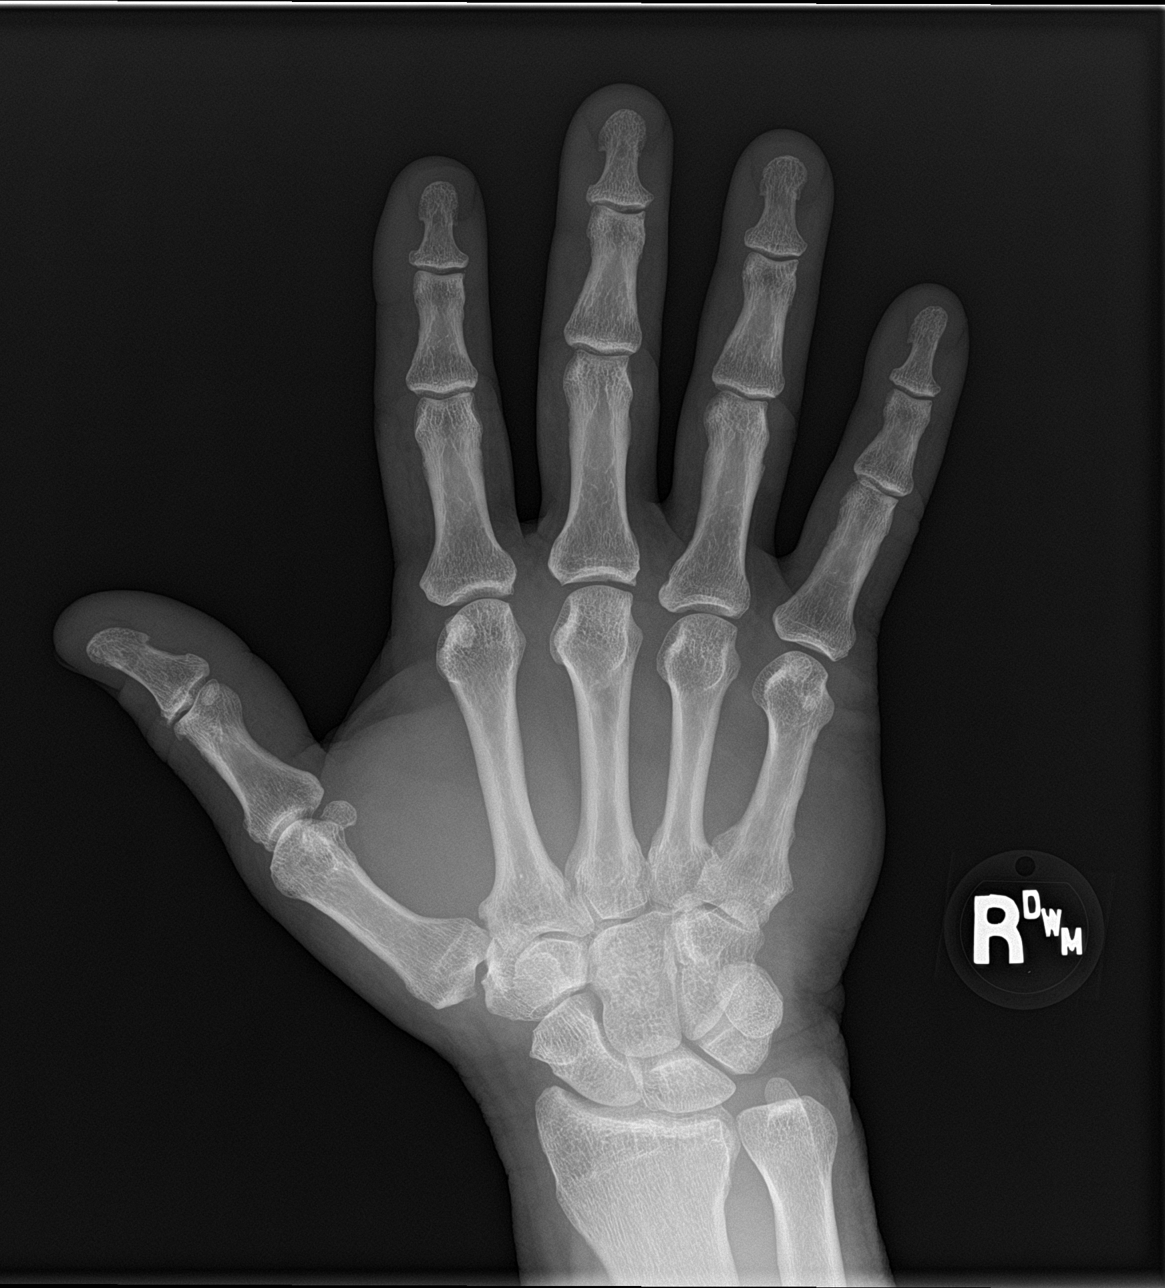

[hand obl]
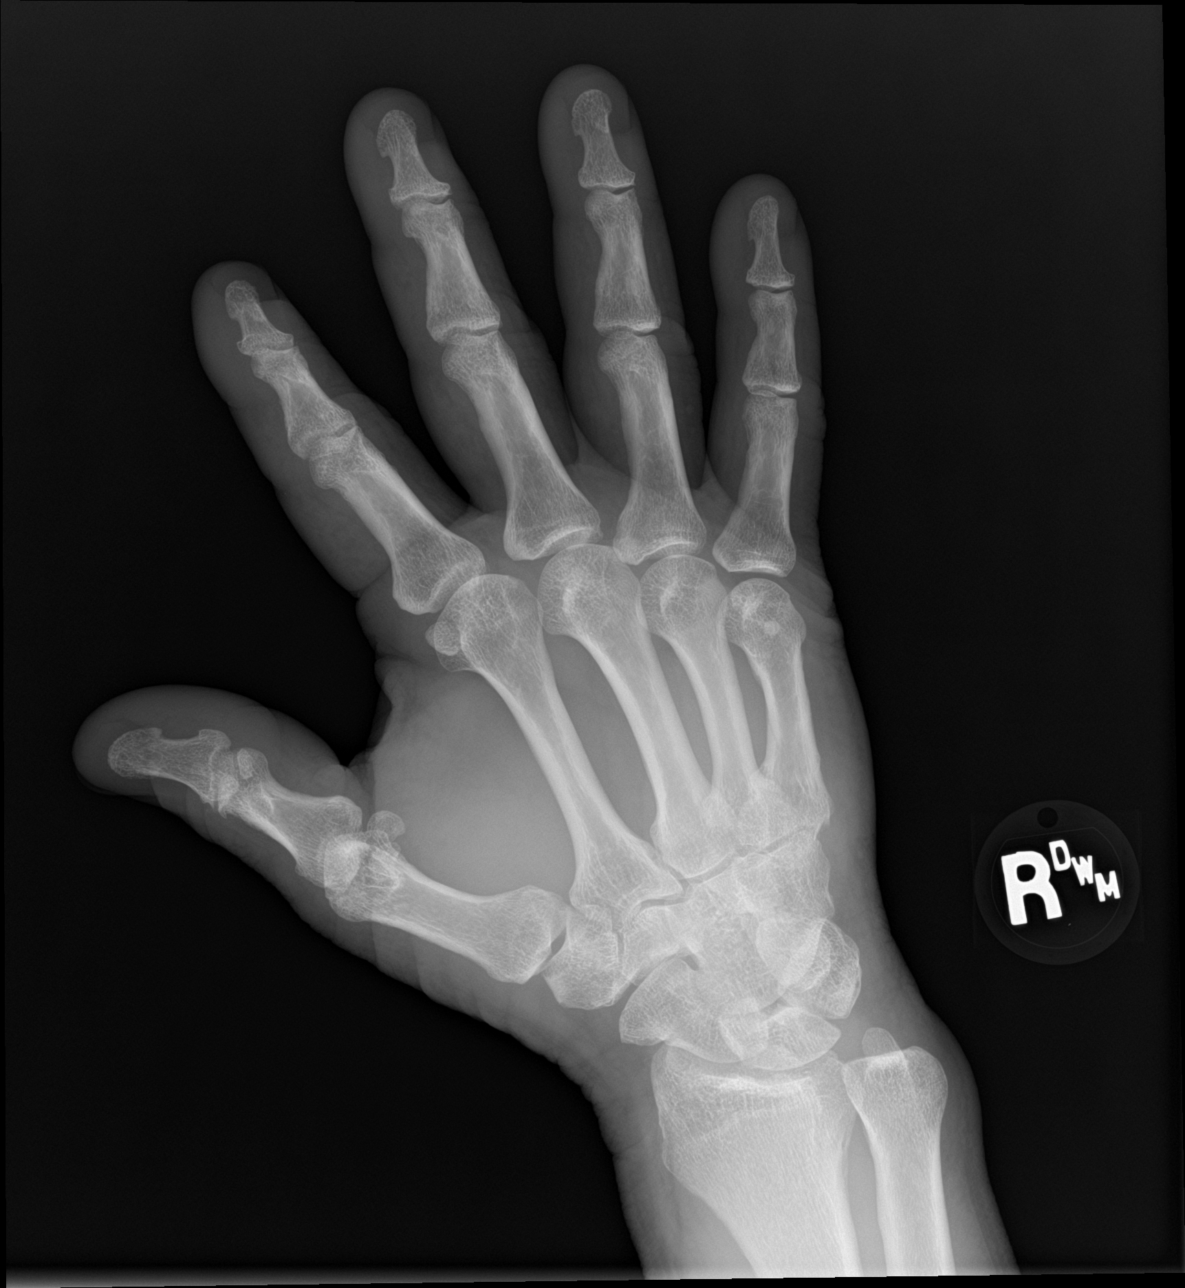

[hand lat]
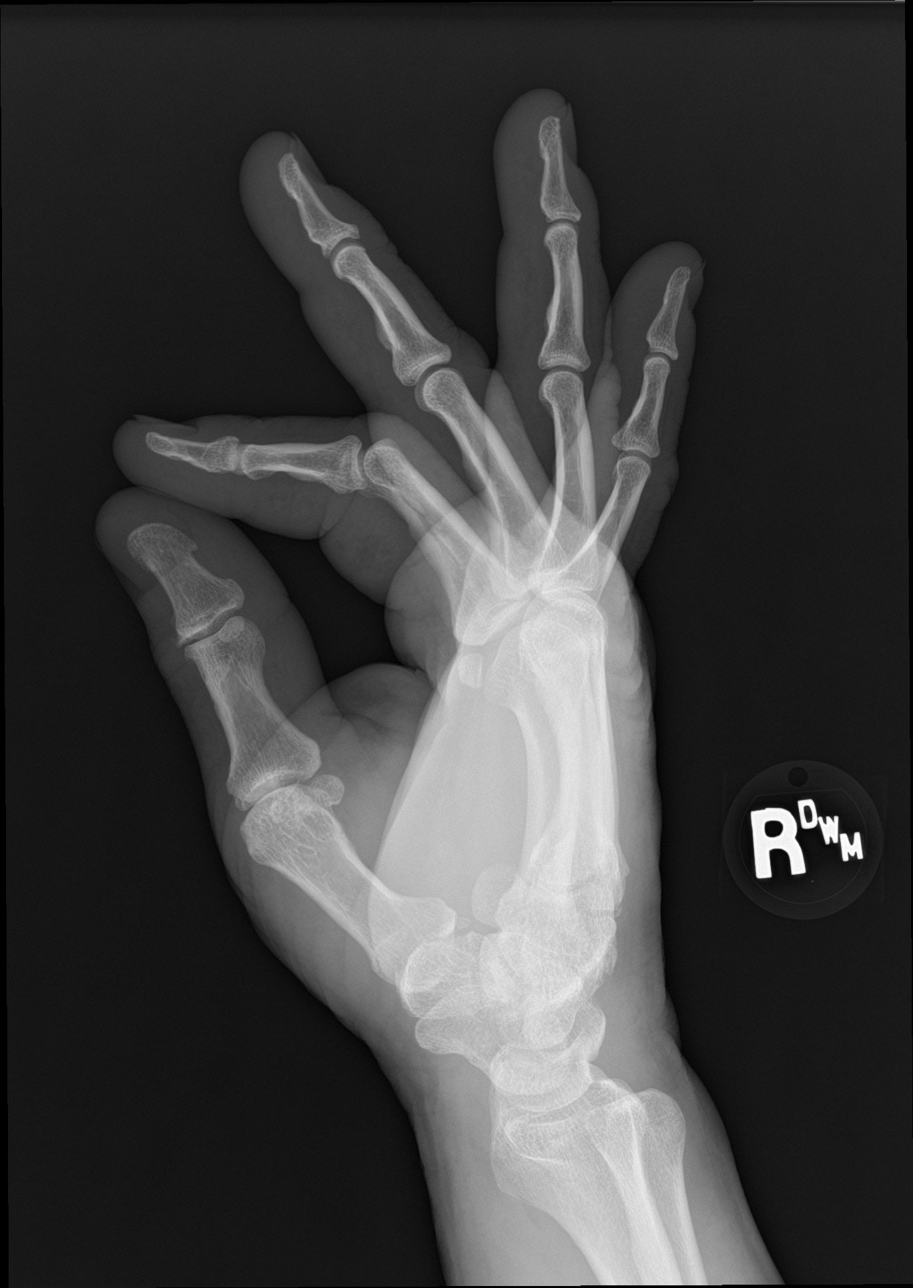

[3 of 3 positions shown; findings below may reference images not displayed]

FINDINGS: Frontal, oblique, and lateral views were obtained. There is soft
tissue swelling dorsally and medially. No acute fracture or
dislocation evident. There is osteoarthritic change in the first IP
joint. Other joint spaces appear normal. No erosive change.
IMPRESSION: Soft tissue swelling dorsally and medially. No acute fracture or
dislocation. Osteoarthritic change first IP joint. Other joint
spaces appear unremarkable.

## 2019-10-07 ENCOUNTER — Other Ambulatory Visit: Payer: Self-pay | Admitting: Osteopathic Medicine
